# Patient Record
Sex: Male | Born: 1945 | Race: White | Hispanic: No
Health system: Southern US, Community
[De-identification: ages and names within clinical notes are randomized; demographics above are authoritative.]

---

## 2008-12-15 ENCOUNTER — Other Ambulatory Visit: Payer: Self-pay

## 2009-08-03 ENCOUNTER — Other Ambulatory Visit: Payer: Self-pay | Admitting: Emergency Medicine

## 2009-10-04 ENCOUNTER — Other Ambulatory Visit: Payer: Self-pay

## 2010-06-29 DIAGNOSIS — R7989 Other specified abnormal findings of blood chemistry: Secondary | ICD-10-CM

## 2010-06-29 DIAGNOSIS — R079 Chest pain, unspecified: Secondary | ICD-10-CM

## 2010-06-29 DIAGNOSIS — I059 Rheumatic mitral valve disease, unspecified: Secondary | ICD-10-CM

## 2010-07-11 ENCOUNTER — Other Ambulatory Visit: Payer: Self-pay | Admitting: Emergency Medicine

## 2010-08-10 ENCOUNTER — Other Ambulatory Visit: Payer: Self-pay | Admitting: Internal Medicine

## 2010-08-26 ENCOUNTER — Other Ambulatory Visit: Payer: Self-pay

## 2011-03-04 ENCOUNTER — Other Ambulatory Visit: Payer: Self-pay | Admitting: Emergency Medicine

## 2011-03-07 ENCOUNTER — Other Ambulatory Visit: Payer: Self-pay | Admitting: Emergency Medicine

## 2011-03-09 ENCOUNTER — Other Ambulatory Visit: Payer: Self-pay | Admitting: Vascular Surgery

## 2012-01-12 ENCOUNTER — Other Ambulatory Visit: Payer: Self-pay | Admitting: Emergency Medicine

## 2012-03-16 ENCOUNTER — Other Ambulatory Visit: Payer: Self-pay | Admitting: Emergency Medicine

## 2012-03-22 ENCOUNTER — Other Ambulatory Visit: Payer: Self-pay | Admitting: Emergency Medicine

## 2012-03-23 ENCOUNTER — Other Ambulatory Visit: Payer: Self-pay | Admitting: Internal Medicine

## 2012-03-30 ENCOUNTER — Other Ambulatory Visit: Payer: Self-pay | Admitting: Emergency Medicine

## 2012-04-04 ENCOUNTER — Other Ambulatory Visit: Payer: Self-pay | Admitting: Internal Medicine

## 2012-05-07 ENCOUNTER — Other Ambulatory Visit: Payer: Self-pay | Admitting: Emergency Medicine

## 2012-05-18 ENCOUNTER — Other Ambulatory Visit: Payer: Self-pay | Admitting: Emergency Medicine

## 2012-05-27 ENCOUNTER — Other Ambulatory Visit: Payer: Self-pay | Admitting: Emergency Medicine

## 2012-05-28 ENCOUNTER — Other Ambulatory Visit: Payer: Self-pay | Admitting: Emergency Medicine

## 2012-06-12 ENCOUNTER — Other Ambulatory Visit: Payer: Self-pay | Admitting: Internal Medicine

## 2012-12-10 ENCOUNTER — Other Ambulatory Visit: Payer: Self-pay

## 2013-01-10 ENCOUNTER — Other Ambulatory Visit: Payer: Self-pay | Admitting: Emergency Medicine

## 2013-01-21 ENCOUNTER — Other Ambulatory Visit: Payer: Self-pay

## 2013-01-27 ENCOUNTER — Other Ambulatory Visit: Payer: Self-pay | Admitting: Emergency Medicine

## 2013-01-30 ENCOUNTER — Other Ambulatory Visit: Payer: Self-pay | Admitting: Cardiology

## 2013-02-04 ENCOUNTER — Other Ambulatory Visit: Payer: Self-pay | Admitting: Emergency Medicine

## 2013-02-10 ENCOUNTER — Other Ambulatory Visit: Payer: Self-pay

## 2013-03-09 ENCOUNTER — Other Ambulatory Visit: Payer: Self-pay | Admitting: Emergency Medicine

## 2013-03-14 ENCOUNTER — Other Ambulatory Visit: Payer: Self-pay | Admitting: Emergency Medicine

## 2013-06-16 ENCOUNTER — Other Ambulatory Visit: Payer: Self-pay

## 2013-06-18 ENCOUNTER — Other Ambulatory Visit: Payer: Self-pay | Admitting: Internal Medicine

## 2013-06-26 ENCOUNTER — Inpatient Hospital Stay
Admission: AD | Admit: 2013-06-26 | Discharge: 2013-07-22 | Disposition: A | Discharge status: Skilled Nursing Facility | Payer: Medicare Other | Source: Ambulatory Visit | Attending: Internal Medicine | Admitting: Internal Medicine

## 2013-06-26 ENCOUNTER — Ambulatory Visit (HOSPITAL_COMMUNITY)
Admission: AD | Admit: 2013-06-26 | Discharge: 2013-06-26 | Disposition: A | Discharge status: Home / Self Care | Payer: Medicare Other | Source: Other Acute Inpatient Hospital | Attending: Internal Medicine | Admitting: Internal Medicine

## 2013-06-26 DIAGNOSIS — J9600 Acute respiratory failure, unspecified whether with hypoxia or hypercapnia: Secondary | ICD-10-CM | POA: Insufficient documentation

## 2013-06-27 ENCOUNTER — Other Ambulatory Visit (HOSPITAL_COMMUNITY): Payer: Medicare Other

## 2013-06-27 LAB — COMPREHENSIVE METABOLIC PANEL
ALT: 15 U/L (ref 0–53)
AST: 20 U/L (ref 0–37)
Albumin: 2.4 g/dL — ABNORMAL LOW (ref 3.5–5.2)
Alkaline Phosphatase: 63 U/L (ref 39–117)
BUN: 39 mg/dL — AB (ref 6–23)
CO2: 31 meq/L (ref 19–32)
CREATININE: 3.39 mg/dL — AB (ref 0.50–1.35)
Calcium: 8.2 mg/dL — ABNORMAL LOW (ref 8.4–10.5)
Chloride: 95 mEq/L — ABNORMAL LOW (ref 96–112)
GFR calc Af Amer: 20 mL/min — ABNORMAL LOW (ref >90)
GFR calc non Af Amer: 17 mL/min — ABNORMAL LOW (ref >90)
Glucose, Bld: 183 mg/dL — ABNORMAL HIGH (ref 70–99)
Potassium: 4.4 mEq/L (ref 3.7–5.3)
Sodium: 138 mEq/L (ref 137–147)
Total Protein: 5.8 g/dL — ABNORMAL LOW (ref 6.0–8.3)

## 2013-06-27 LAB — CBC WITH DIFFERENTIAL/PLATELET
Basophils Absolute: 0 10*3/uL (ref 0.0–0.1)
Basophils Relative: 0 % (ref 0–1)
Eosinophils Absolute: 0.2 10*3/uL (ref 0.0–0.7)
Eosinophils Relative: 2 % (ref 0–5)
HEMATOCRIT: 33.2 % — AB (ref 39.0–52.0)
HEMOGLOBIN: 9.5 g/dL — AB (ref 13.0–17.0)
LYMPHS PCT: 12 % (ref 12–46)
Lymphs Abs: 1 10*3/uL (ref 0.7–4.0)
MCH: 23.8 pg — ABNORMAL LOW (ref 26.0–34.0)
MCHC: 28.6 g/dL — ABNORMAL LOW (ref 30.0–36.0)
MCV: 83.2 fL (ref 78.0–100.0)
MONO ABS: 1 10*3/uL (ref 0.1–1.0)
MONOS PCT: 13 % — AB (ref 3–12)
NEUTROS ABS: 5.8 10*3/uL (ref 1.7–7.7)
Neutrophils Relative %: 73 % (ref 43–77)
Platelets: 232 10*3/uL (ref 150–400)
RBC: 3.99 MIL/uL — ABNORMAL LOW (ref 4.22–5.81)
RDW: 18.6 % — ABNORMAL HIGH (ref 11.5–15.5)
WBC: 7.9 10*3/uL (ref 4.0–10.5)

## 2013-06-27 LAB — PROCALCITONIN: Procalcitonin: 1.59 ng/mL

## 2013-06-27 LAB — VITAMIN B12: Vitamin B-12: 785 pg/mL (ref 211–911)

## 2013-06-27 LAB — BLOOD GAS, ARTERIAL
Acid-Base Excess: 6.5 mmol/L — ABNORMAL HIGH (ref 0.0–2.0)
BICARBONATE: 31.8 meq/L — AB (ref 20.0–24.0)
Delivery systems: POSITIVE
Expiratory PAP: 10
FIO2: 0.3 %
O2 Saturation: 96.9 %
PCO2 ART: 57.9 mmHg — AB (ref 35.0–45.0)
PH ART: 7.359 (ref 7.350–7.450)
PO2 ART: 94.9 mmHg (ref 80.0–100.0)
Patient temperature: 98.6
TCO2: 33.6 mmol/L (ref 0–100)

## 2013-06-27 LAB — HEMOGLOBIN A1C
Hgb A1c MFr Bld: 7.1 % — ABNORMAL HIGH (ref <5.7)
Mean Plasma Glucose: 157 mg/dL — ABNORMAL HIGH (ref <117)

## 2013-06-27 LAB — T4, FREE: FREE T4: 0.95 ng/dL (ref 0.80–1.80)

## 2013-06-27 LAB — MAGNESIUM: MAGNESIUM: 2 mg/dL (ref 1.5–2.5)

## 2013-06-27 LAB — TSH: TSH: 2.64 u[IU]/mL (ref 0.350–4.500)

## 2013-06-27 LAB — PROTIME-INR
INR: 1.08 (ref 0.00–1.49)
Prothrombin Time: 14 seconds (ref 11.6–15.2)

## 2013-06-27 LAB — FERRITIN: Ferritin: 131 ng/mL (ref 22–322)

## 2013-06-27 LAB — PHOSPHORUS: PHOSPHORUS: 4.7 mg/dL — AB (ref 2.3–4.6)

## 2013-06-27 LAB — HEPATITIS B SURFACE ANTIBODY,QUALITATIVE: Hep B S Ab: POSITIVE — AB

## 2013-06-27 LAB — PREALBUMIN: PREALBUMIN: 27.7 mg/dL (ref 17.0–34.0)

## 2013-06-27 LAB — APTT: aPTT: 36 seconds (ref 24–37)

## 2013-06-27 LAB — HEPATITIS B CORE ANTIBODY, TOTAL: HEP B C TOTAL AB: NONREACTIVE

## 2013-06-27 LAB — HEPATITIS B SURFACE ANTIGEN: Hepatitis B Surface Ag: NEGATIVE

## 2013-06-28 ENCOUNTER — Other Ambulatory Visit (HOSPITAL_COMMUNITY): Payer: Medicare Other

## 2013-06-28 LAB — BASIC METABOLIC PANEL
BUN: 25 mg/dL — ABNORMAL HIGH (ref 6–23)
CO2: 29 mEq/L (ref 19–32)
CREATININE: 3.05 mg/dL — AB (ref 0.50–1.35)
Calcium: 7.8 mg/dL — ABNORMAL LOW (ref 8.4–10.5)
Chloride: 100 mEq/L (ref 96–112)
GFR, EST AFRICAN AMERICAN: 23 mL/min — AB (ref >90)
GFR, EST NON AFRICAN AMERICAN: 20 mL/min — AB (ref >90)
Glucose, Bld: 170 mg/dL — ABNORMAL HIGH (ref 70–99)
POTASSIUM: 4.1 meq/L (ref 3.7–5.3)
Sodium: 141 mEq/L (ref 137–147)

## 2013-06-28 LAB — MAGNESIUM: MAGNESIUM: 1.9 mg/dL (ref 1.5–2.5)

## 2013-06-28 LAB — CBC WITH DIFFERENTIAL/PLATELET
BASOS ABS: 0 10*3/uL (ref 0.0–0.1)
BASOS PCT: 0 % (ref 0–1)
Eosinophils Absolute: 0.4 10*3/uL (ref 0.0–0.7)
Eosinophils Relative: 4 % (ref 0–5)
HCT: 34.1 % — ABNORMAL LOW (ref 39.0–52.0)
Hemoglobin: 9.7 g/dL — ABNORMAL LOW (ref 13.0–17.0)
Lymphocytes Relative: 10 % — ABNORMAL LOW (ref 12–46)
Lymphs Abs: 0.9 10*3/uL (ref 0.7–4.0)
MCH: 24.6 pg — ABNORMAL LOW (ref 26.0–34.0)
MCHC: 28.4 g/dL — AB (ref 30.0–36.0)
MCV: 86.5 fL (ref 78.0–100.0)
MONO ABS: 1.1 10*3/uL — AB (ref 0.1–1.0)
Monocytes Relative: 13 % — ABNORMAL HIGH (ref 3–12)
NEUTROS PCT: 73 % (ref 43–77)
Neutro Abs: 6.4 10*3/uL (ref 1.7–7.7)
Platelets: 193 10*3/uL (ref 150–400)
RBC: 3.94 MIL/uL — ABNORMAL LOW (ref 4.22–5.81)
RDW: 19.1 % — AB (ref 11.5–15.5)
WBC: 8.8 10*3/uL (ref 4.0–10.5)

## 2013-06-28 LAB — FOLATE RBC: RBC FOLATE: 487 ng/mL (ref >280)

## 2013-06-28 LAB — PHOSPHORUS: Phosphorus: 3 mg/dL (ref 2.3–4.6)

## 2013-06-30 LAB — HEPATIC FUNCTION PANEL
ALK PHOS: 70 U/L (ref 39–117)
ALT: 11 U/L (ref 0–53)
AST: 16 U/L (ref 0–37)
Albumin: 2.4 g/dL — ABNORMAL LOW (ref 3.5–5.2)
BILIRUBIN TOTAL: 0.2 mg/dL — AB (ref 0.3–1.2)
TOTAL PROTEIN: 5.9 g/dL — AB (ref 6.0–8.3)

## 2013-06-30 LAB — RENAL FUNCTION PANEL
ALBUMIN: 2.3 g/dL — AB (ref 3.5–5.2)
BUN: 54 mg/dL — AB (ref 6–23)
CALCIUM: 7.8 mg/dL — AB (ref 8.4–10.5)
CO2: 26 mEq/L (ref 19–32)
CREATININE: 4.82 mg/dL — AB (ref 0.50–1.35)
Chloride: 94 mEq/L — ABNORMAL LOW (ref 96–112)
GFR calc Af Amer: 13 mL/min — ABNORMAL LOW (ref >90)
GFR calc non Af Amer: 11 mL/min — ABNORMAL LOW (ref >90)
Glucose, Bld: 160 mg/dL — ABNORMAL HIGH (ref 70–99)
PHOSPHORUS: 2.6 mg/dL (ref 2.3–4.6)
Potassium: 4.9 mEq/L (ref 3.7–5.3)
Sodium: 133 mEq/L — ABNORMAL LOW (ref 137–147)

## 2013-06-30 LAB — PREALBUMIN: PREALBUMIN: 21.3 mg/dL (ref 17.0–34.0)

## 2013-06-30 LAB — CBC WITH DIFFERENTIAL/PLATELET
BASOS ABS: 0 10*3/uL (ref 0.0–0.1)
BASOS PCT: 0 % (ref 0–1)
Eosinophils Absolute: 0.4 10*3/uL (ref 0.0–0.7)
Eosinophils Relative: 4 % (ref 0–5)
HCT: 30.8 % — ABNORMAL LOW (ref 39.0–52.0)
Hemoglobin: 8.8 g/dL — ABNORMAL LOW (ref 13.0–17.0)
Lymphocytes Relative: 10 % — ABNORMAL LOW (ref 12–46)
Lymphs Abs: 0.9 10*3/uL (ref 0.7–4.0)
MCH: 25.1 pg — AB (ref 26.0–34.0)
MCHC: 28.6 g/dL — ABNORMAL LOW (ref 30.0–36.0)
MCV: 87.7 fL (ref 78.0–100.0)
Monocytes Absolute: 1.3 10*3/uL — ABNORMAL HIGH (ref 0.1–1.0)
Monocytes Relative: 14 % — ABNORMAL HIGH (ref 3–12)
NEUTROS ABS: 6.8 10*3/uL (ref 1.7–7.7)
NEUTROS PCT: 72 % (ref 43–77)
PLATELETS: 141 10*3/uL — AB (ref 150–400)
RBC: 3.51 MIL/uL — ABNORMAL LOW (ref 4.22–5.81)
RDW: 18.9 % — AB (ref 11.5–15.5)
WBC: 9.3 10*3/uL (ref 4.0–10.5)

## 2013-07-02 LAB — CBC
HCT: 27.4 % — ABNORMAL LOW (ref 39.0–52.0)
Hemoglobin: 7.9 g/dL — ABNORMAL LOW (ref 13.0–17.0)
MCH: 24.8 pg — AB (ref 26.0–34.0)
MCHC: 28.8 g/dL — ABNORMAL LOW (ref 30.0–36.0)
MCV: 85.9 fL (ref 78.0–100.0)
PLATELETS: 111 10*3/uL — AB (ref 150–400)
RBC: 3.19 MIL/uL — AB (ref 4.22–5.81)
RDW: 18.9 % — AB (ref 11.5–15.5)
WBC: 10.4 10*3/uL (ref 4.0–10.5)

## 2013-07-02 LAB — RENAL FUNCTION PANEL
ALBUMIN: 2.3 g/dL — AB (ref 3.5–5.2)
BUN: 56 mg/dL — ABNORMAL HIGH (ref 6–23)
CHLORIDE: 94 meq/L — AB (ref 96–112)
CO2: 27 mEq/L (ref 19–32)
Calcium: 8.2 mg/dL — ABNORMAL LOW (ref 8.4–10.5)
Creatinine, Ser: 5.26 mg/dL — ABNORMAL HIGH (ref 0.50–1.35)
GFR calc Af Amer: 12 mL/min — ABNORMAL LOW (ref >90)
GFR calc non Af Amer: 10 mL/min — ABNORMAL LOW (ref >90)
Glucose, Bld: 114 mg/dL — ABNORMAL HIGH (ref 70–99)
POTASSIUM: 5.4 meq/L — AB (ref 3.7–5.3)
Phosphorus: 3 mg/dL (ref 2.3–4.6)
Sodium: 133 mEq/L — ABNORMAL LOW (ref 137–147)

## 2013-07-03 ENCOUNTER — Other Ambulatory Visit (HOSPITAL_COMMUNITY): Payer: Medicare Other

## 2013-07-03 LAB — CK TOTAL AND CKMB (NOT AT ARMC)
CK TOTAL: 21 U/L (ref 7–232)
CK, MB: 5.2 ng/mL — ABNORMAL HIGH (ref 0.3–4.0)
CK, MB: 4.2 ng/mL — AB (ref 0.3–4.0)
CK, MB: 4.3 ng/mL — ABNORMAL HIGH (ref 0.3–4.0)
RELATIVE INDEX: INVALID (ref 0.0–2.5)
RELATIVE INDEX: INVALID (ref 0.0–2.5)
Relative Index: INVALID (ref 0.0–2.5)
Total CK: 20 U/L (ref 7–232)
Total CK: 21 U/L (ref 7–232)

## 2013-07-03 LAB — POTASSIUM: POTASSIUM: 5.9 meq/L — AB (ref 3.7–5.3)

## 2013-07-03 LAB — TROPONIN I: Troponin I: <0.3 ng/mL (ref <0.30)

## 2013-07-04 ENCOUNTER — Institutional Professional Consult (permissible substitution) (HOSPITAL_COMMUNITY): Payer: Medicare Other

## 2013-07-04 ENCOUNTER — Other Ambulatory Visit (HOSPITAL_COMMUNITY): Payer: Medicare Other

## 2013-07-04 LAB — RENAL FUNCTION PANEL
ALBUMIN: 2.3 g/dL — AB (ref 3.5–5.2)
ANION GAP: 13 (ref 5–15)
BUN: 57 mg/dL — ABNORMAL HIGH (ref 6–23)
CHLORIDE: 94 meq/L — AB (ref 96–112)
CO2: 26 mEq/L (ref 19–32)
Calcium: 8.8 mg/dL (ref 8.4–10.5)
Creatinine, Ser: 5.4 mg/dL — ABNORMAL HIGH (ref 0.50–1.35)
GFR calc Af Amer: 11 mL/min — ABNORMAL LOW (ref >90)
GFR calc non Af Amer: 10 mL/min — ABNORMAL LOW (ref >90)
Glucose, Bld: 153 mg/dL — ABNORMAL HIGH (ref 70–99)
POTASSIUM: 6 meq/L — AB (ref 3.7–5.3)
Phosphorus: 3.9 mg/dL (ref 2.3–4.6)
Sodium: 133 mEq/L — ABNORMAL LOW (ref 137–147)

## 2013-07-04 LAB — URINALYSIS, ROUTINE W REFLEX MICROSCOPIC
GLUCOSE, UA: 250 mg/dL — AB
Ketones, ur: 15 mg/dL — AB
Nitrite: NEGATIVE
PH: 6 (ref 5.0–8.0)
Specific Gravity, Urine: 1.034 — ABNORMAL HIGH (ref 1.005–1.030)
Urobilinogen, UA: 0.2 mg/dL (ref 0.0–1.0)

## 2013-07-04 LAB — CBC
HCT: 28.5 % — ABNORMAL LOW (ref 39.0–52.0)
Hemoglobin: 8.1 g/dL — ABNORMAL LOW (ref 13.0–17.0)
MCH: 24.5 pg — ABNORMAL LOW (ref 26.0–34.0)
MCHC: 28.4 g/dL — ABNORMAL LOW (ref 30.0–36.0)
MCV: 86.1 fL (ref 78.0–100.0)
PLATELETS: 111 10*3/uL — AB (ref 150–400)
RBC: 3.31 MIL/uL — ABNORMAL LOW (ref 4.22–5.81)
RDW: 18.6 % — AB (ref 11.5–15.5)
WBC: 10 10*3/uL (ref 4.0–10.5)

## 2013-07-04 LAB — URINE MICROSCOPIC-ADD ON

## 2013-07-05 ENCOUNTER — Other Ambulatory Visit (HOSPITAL_COMMUNITY): Payer: Medicare Other

## 2013-07-05 LAB — URINE CULTURE
CULTURE: NO GROWTH
Colony Count: NO GROWTH

## 2013-07-05 LAB — CBC
HCT: 26.8 % — ABNORMAL LOW (ref 39.0–52.0)
HEMOGLOBIN: 7.7 g/dL — AB (ref 13.0–17.0)
MCH: 24.1 pg — AB (ref 26.0–34.0)
MCHC: 28.7 g/dL — AB (ref 30.0–36.0)
MCV: 84 fL (ref 78.0–100.0)
PLATELETS: 126 10*3/uL — AB (ref 150–400)
RBC: 3.19 MIL/uL — ABNORMAL LOW (ref 4.22–5.81)
RDW: 18.5 % — AB (ref 11.5–15.5)
WBC: 7.2 10*3/uL (ref 4.0–10.5)

## 2013-07-05 LAB — BASIC METABOLIC PANEL
ANION GAP: 14 (ref 5–15)
BUN: 43 mg/dL — ABNORMAL HIGH (ref 6–23)
CALCIUM: 8.7 mg/dL (ref 8.4–10.5)
CO2: 27 mEq/L (ref 19–32)
Chloride: 95 mEq/L — ABNORMAL LOW (ref 96–112)
Creatinine, Ser: 4.33 mg/dL — ABNORMAL HIGH (ref 0.50–1.35)
GFR calc non Af Amer: 13 mL/min — ABNORMAL LOW (ref >90)
GFR, EST AFRICAN AMERICAN: 15 mL/min — AB (ref >90)
GLUCOSE: 106 mg/dL — AB (ref 70–99)
POTASSIUM: 4.9 meq/L (ref 3.7–5.3)
Sodium: 136 mEq/L — ABNORMAL LOW (ref 137–147)

## 2013-07-06 ENCOUNTER — Other Ambulatory Visit (HOSPITAL_COMMUNITY): Payer: Medicare Other

## 2013-07-06 LAB — MAGNESIUM: Magnesium: 2.1 mg/dL (ref 1.5–2.5)

## 2013-07-06 LAB — CBC WITH DIFFERENTIAL/PLATELET
BASOS PCT: 0 % (ref 0–1)
Basophils Absolute: 0 10*3/uL (ref 0.0–0.1)
Eosinophils Absolute: 0.2 10*3/uL (ref 0.0–0.7)
Eosinophils Relative: 3 % (ref 0–5)
HCT: 26.9 % — ABNORMAL LOW (ref 39.0–52.0)
HEMOGLOBIN: 7.9 g/dL — AB (ref 13.0–17.0)
Lymphocytes Relative: 9 % — ABNORMAL LOW (ref 12–46)
Lymphs Abs: 0.7 10*3/uL (ref 0.7–4.0)
MCH: 24.5 pg — ABNORMAL LOW (ref 26.0–34.0)
MCHC: 29.4 g/dL — ABNORMAL LOW (ref 30.0–36.0)
MCV: 83.3 fL (ref 78.0–100.0)
MONOS PCT: 11 % (ref 3–12)
Monocytes Absolute: 0.8 10*3/uL (ref 0.1–1.0)
Neutro Abs: 5.5 10*3/uL (ref 1.7–7.7)
Neutrophils Relative %: 77 % (ref 43–77)
Platelets: 124 10*3/uL — ABNORMAL LOW (ref 150–400)
RBC: 3.23 MIL/uL — AB (ref 4.22–5.81)
RDW: 18.3 % — ABNORMAL HIGH (ref 11.5–15.5)
WBC: 7.2 10*3/uL (ref 4.0–10.5)

## 2013-07-06 LAB — BASIC METABOLIC PANEL
Anion gap: 13 (ref 5–15)
BUN: 56 mg/dL — AB (ref 6–23)
CO2: 28 mEq/L (ref 19–32)
Calcium: 9 mg/dL (ref 8.4–10.5)
Chloride: 92 mEq/L — ABNORMAL LOW (ref 96–112)
Creatinine, Ser: 5.29 mg/dL — ABNORMAL HIGH (ref 0.50–1.35)
GFR calc non Af Amer: 10 mL/min — ABNORMAL LOW (ref >90)
GFR, EST AFRICAN AMERICAN: 12 mL/min — AB (ref >90)
Glucose, Bld: 80 mg/dL (ref 70–99)
POTASSIUM: 5.4 meq/L — AB (ref 3.7–5.3)
SODIUM: 133 meq/L — AB (ref 137–147)

## 2013-07-06 LAB — PHOSPHORUS: PHOSPHORUS: 3.8 mg/dL (ref 2.3–4.6)

## 2013-07-07 LAB — RENAL FUNCTION PANEL
ALBUMIN: 2.4 g/dL — AB (ref 3.5–5.2)
ANION GAP: 13 (ref 5–15)
BUN: 63 mg/dL — ABNORMAL HIGH (ref 6–23)
CALCIUM: 8.7 mg/dL (ref 8.4–10.5)
CO2: 27 mEq/L (ref 19–32)
Chloride: 92 mEq/L — ABNORMAL LOW (ref 96–112)
Creatinine, Ser: 6.04 mg/dL — ABNORMAL HIGH (ref 0.50–1.35)
GFR, EST AFRICAN AMERICAN: 10 mL/min — AB (ref >90)
GFR, EST NON AFRICAN AMERICAN: 9 mL/min — AB (ref >90)
Glucose, Bld: 116 mg/dL — ABNORMAL HIGH (ref 70–99)
PHOSPHORUS: 4.1 mg/dL (ref 2.3–4.6)
Potassium: 5.3 mEq/L (ref 3.7–5.3)
SODIUM: 132 meq/L — AB (ref 137–147)

## 2013-07-07 LAB — CBC
HEMATOCRIT: 27.1 % — AB (ref 39.0–52.0)
Hemoglobin: 7.9 g/dL — ABNORMAL LOW (ref 13.0–17.0)
MCH: 24.2 pg — ABNORMAL LOW (ref 26.0–34.0)
MCHC: 29.2 g/dL — ABNORMAL LOW (ref 30.0–36.0)
MCV: 83.1 fL (ref 78.0–100.0)
Platelets: 140 10*3/uL — ABNORMAL LOW (ref 150–400)
RBC: 3.26 MIL/uL — ABNORMAL LOW (ref 4.22–5.81)
RDW: 18.3 % — AB (ref 11.5–15.5)
WBC: 6.5 10*3/uL (ref 4.0–10.5)

## 2013-07-07 LAB — PREALBUMIN: PREALBUMIN: 11 mg/dL — AB (ref 17.0–34.0)

## 2013-07-08 LAB — BASIC METABOLIC PANEL
ANION GAP: 13 (ref 5–15)
BUN: 45 mg/dL — AB (ref 6–23)
CALCIUM: 8.6 mg/dL (ref 8.4–10.5)
CO2: 29 mEq/L (ref 19–32)
CREATININE: 4.41 mg/dL — AB (ref 0.50–1.35)
Chloride: 93 mEq/L — ABNORMAL LOW (ref 96–112)
GFR, EST AFRICAN AMERICAN: 15 mL/min — AB (ref >90)
GFR, EST NON AFRICAN AMERICAN: 13 mL/min — AB (ref >90)
Glucose, Bld: 115 mg/dL — ABNORMAL HIGH (ref 70–99)
Potassium: 5 mEq/L (ref 3.7–5.3)
Sodium: 135 mEq/L — ABNORMAL LOW (ref 137–147)

## 2013-07-08 LAB — PHOSPHORUS: Phosphorus: 3.1 mg/dL (ref 2.3–4.6)

## 2013-07-08 LAB — MAGNESIUM: Magnesium: 2 mg/dL (ref 1.5–2.5)

## 2013-07-09 LAB — CBC
HEMATOCRIT: 28.3 % — AB (ref 39.0–52.0)
Hemoglobin: 8.2 g/dL — ABNORMAL LOW (ref 13.0–17.0)
MCH: 24.5 pg — AB (ref 26.0–34.0)
MCHC: 29 g/dL — AB (ref 30.0–36.0)
MCV: 84.5 fL (ref 78.0–100.0)
PLATELETS: 159 10*3/uL (ref 150–400)
RBC: 3.35 MIL/uL — ABNORMAL LOW (ref 4.22–5.81)
RDW: 18.4 % — AB (ref 11.5–15.5)
WBC: 6.6 10*3/uL (ref 4.0–10.5)

## 2013-07-09 LAB — RENAL FUNCTION PANEL
ALBUMIN: 2.6 g/dL — AB (ref 3.5–5.2)
Anion gap: 15 (ref 5–15)
BUN: 53 mg/dL — AB (ref 6–23)
CALCIUM: 8.5 mg/dL (ref 8.4–10.5)
CO2: 29 mEq/L (ref 19–32)
Chloride: 90 mEq/L — ABNORMAL LOW (ref 96–112)
Creatinine, Ser: 5.27 mg/dL — ABNORMAL HIGH (ref 0.50–1.35)
GFR calc Af Amer: 12 mL/min — ABNORMAL LOW (ref >90)
GFR, EST NON AFRICAN AMERICAN: 10 mL/min — AB (ref >90)
Glucose, Bld: 108 mg/dL — ABNORMAL HIGH (ref 70–99)
Phosphorus: 3.7 mg/dL (ref 2.3–4.6)
Potassium: 4.6 mEq/L (ref 3.7–5.3)
Sodium: 134 mEq/L — ABNORMAL LOW (ref 137–147)

## 2013-07-11 ENCOUNTER — Other Ambulatory Visit (HOSPITAL_COMMUNITY): Payer: Medicare Other

## 2013-07-11 LAB — RENAL FUNCTION PANEL
Albumin: 2.4 g/dL — ABNORMAL LOW (ref 3.5–5.2)
Anion gap: 13 (ref 5–15)
BUN: 44 mg/dL — AB (ref 6–23)
CHLORIDE: 94 meq/L — AB (ref 96–112)
CO2: 28 meq/L (ref 19–32)
Calcium: 8.6 mg/dL (ref 8.4–10.5)
Creatinine, Ser: 5.16 mg/dL — ABNORMAL HIGH (ref 0.50–1.35)
GFR calc Af Amer: 12 mL/min — ABNORMAL LOW (ref >90)
GFR calc non Af Amer: 10 mL/min — ABNORMAL LOW (ref >90)
Glucose, Bld: 124 mg/dL — ABNORMAL HIGH (ref 70–99)
Phosphorus: 3.2 mg/dL (ref 2.3–4.6)
Potassium: 5 mEq/L (ref 3.7–5.3)
Sodium: 135 mEq/L — ABNORMAL LOW (ref 137–147)

## 2013-07-11 LAB — CBC
HEMATOCRIT: 27 % — AB (ref 39.0–52.0)
Hemoglobin: 7.7 g/dL — ABNORMAL LOW (ref 13.0–17.0)
MCH: 24.1 pg — ABNORMAL LOW (ref 26.0–34.0)
MCHC: 28.5 g/dL — ABNORMAL LOW (ref 30.0–36.0)
MCV: 84.4 fL (ref 78.0–100.0)
PLATELETS: 181 10*3/uL (ref 150–400)
RBC: 3.2 MIL/uL — AB (ref 4.22–5.81)
RDW: 18.5 % — AB (ref 11.5–15.5)
WBC: 6.7 10*3/uL (ref 4.0–10.5)

## 2013-07-14 ENCOUNTER — Institutional Professional Consult (permissible substitution) (HOSPITAL_COMMUNITY): Payer: Medicare Other

## 2013-07-14 ENCOUNTER — Other Ambulatory Visit (HOSPITAL_COMMUNITY): Payer: Medicare Other

## 2013-07-14 LAB — LACTATE DEHYDROGENASE, PLEURAL OR PERITONEAL FLUID: LD, Fluid: 79 U/L — ABNORMAL HIGH (ref 3–23)

## 2013-07-14 LAB — RENAL FUNCTION PANEL
Albumin: 2.4 g/dL — ABNORMAL LOW (ref 3.5–5.2)
Anion gap: 15 (ref 5–15)
BUN: 66 mg/dL — AB (ref 6–23)
CALCIUM: 8.8 mg/dL (ref 8.4–10.5)
CHLORIDE: 90 meq/L — AB (ref 96–112)
CO2: 26 mEq/L (ref 19–32)
CREATININE: 6.18 mg/dL — AB (ref 0.50–1.35)
GFR calc Af Amer: 10 mL/min — ABNORMAL LOW (ref >90)
GFR calc non Af Amer: 8 mL/min — ABNORMAL LOW (ref >90)
GLUCOSE: 103 mg/dL — AB (ref 70–99)
Phosphorus: 3.3 mg/dL (ref 2.3–4.6)
Potassium: 5.6 mEq/L — ABNORMAL HIGH (ref 3.7–5.3)
Sodium: 131 mEq/L — ABNORMAL LOW (ref 137–147)

## 2013-07-14 LAB — CBC
HEMATOCRIT: 26.2 % — AB (ref 39.0–52.0)
Hemoglobin: 7.5 g/dL — ABNORMAL LOW (ref 13.0–17.0)
MCH: 24 pg — AB (ref 26.0–34.0)
MCHC: 28.6 g/dL — ABNORMAL LOW (ref 30.0–36.0)
MCV: 84 fL (ref 78.0–100.0)
Platelets: 205 10*3/uL (ref 150–400)
RBC: 3.12 MIL/uL — AB (ref 4.22–5.81)
RDW: 18.5 % — ABNORMAL HIGH (ref 11.5–15.5)
WBC: 7.8 10*3/uL (ref 4.0–10.5)

## 2013-07-14 LAB — BODY FLUID CELL COUNT WITH DIFFERENTIAL
Eos, Fluid: 1 %
Lymphs, Fluid: 42 %
Monocyte-Macrophage-Serous Fluid: 50 % (ref 50–90)
Neutrophil Count, Fluid: 7 % (ref 0–25)
WBC FLUID: 449 uL (ref 0–1000)

## 2013-07-14 LAB — PREALBUMIN: PREALBUMIN: 9.4 mg/dL — AB (ref 17.0–34.0)

## 2013-07-14 NOTE — Procedures (Signed)
  US guided bedside thora  1.2 liters yellow fluid Sent for labs per MD  Port CXR pending

## 2013-07-15 ENCOUNTER — Encounter: Payer: Self-pay | Admitting: Radiology

## 2013-07-15 ENCOUNTER — Other Ambulatory Visit (HOSPITAL_COMMUNITY): Payer: Medicare Other

## 2013-07-15 LAB — BASIC METABOLIC PANEL
ANION GAP: 13 (ref 5–15)
BUN: 39 mg/dL — ABNORMAL HIGH (ref 6–23)
CHLORIDE: 93 meq/L — AB (ref 96–112)
CO2: 27 meq/L (ref 19–32)
CREATININE: 4.31 mg/dL — AB (ref 0.50–1.35)
Calcium: 8.6 mg/dL (ref 8.4–10.5)
GFR calc Af Amer: 15 mL/min — ABNORMAL LOW (ref >90)
GFR calc non Af Amer: 13 mL/min — ABNORMAL LOW (ref >90)
Glucose, Bld: 113 mg/dL — ABNORMAL HIGH (ref 70–99)
Potassium: 4.7 mEq/L (ref 3.7–5.3)
Sodium: 133 mEq/L — ABNORMAL LOW (ref 137–147)

## 2013-07-15 LAB — BLOOD GAS, ARTERIAL
Acid-Base Excess: 1.3 mmol/L (ref 0.0–2.0)
Bicarbonate: 27.4 mEq/L — ABNORMAL HIGH (ref 20.0–24.0)
O2 Content: 3.5 L/min
O2 Saturation: 98 %
PO2 ART: 113 mmHg — AB (ref 80.0–100.0)
Patient temperature: 98.6
TCO2: 29.3 mmol/L (ref 0–100)
pCO2 arterial: 60.9 mmHg (ref 35.0–45.0)
pH, Arterial: 7.275 — ABNORMAL LOW (ref 7.350–7.450)

## 2013-07-15 LAB — CBC
HEMATOCRIT: 28.3 % — AB (ref 39.0–52.0)
HEMOGLOBIN: 8.2 g/dL — AB (ref 13.0–17.0)
MCH: 24.9 pg — ABNORMAL LOW (ref 26.0–34.0)
MCHC: 29 g/dL — ABNORMAL LOW (ref 30.0–36.0)
MCV: 86 fL (ref 78.0–100.0)
Platelets: 227 10*3/uL (ref 150–400)
RBC: 3.29 MIL/uL — ABNORMAL LOW (ref 4.22–5.81)
RDW: 18.4 % — ABNORMAL HIGH (ref 11.5–15.5)
WBC: 6.6 10*3/uL (ref 4.0–10.5)

## 2013-07-15 MED ORDER — IOHEXOL 300 MG/ML  SOLN
80.0000 mL | Freq: Once | INTRAMUSCULAR | Status: AC | PRN
  Administered 2013-07-15: 80 mL via INTRAVENOUS

## 2013-07-16 LAB — PATHOLOGIST SMEAR REVIEW

## 2013-07-16 LAB — CBC
HEMATOCRIT: 26.4 % — AB (ref 39.0–52.0)
HEMOGLOBIN: 7.6 g/dL — AB (ref 13.0–17.0)
MCH: 24.2 pg — ABNORMAL LOW (ref 26.0–34.0)
MCHC: 28.8 g/dL — ABNORMAL LOW (ref 30.0–36.0)
MCV: 84.1 fL (ref 78.0–100.0)
Platelets: 245 10*3/uL (ref 150–400)
RBC: 3.14 MIL/uL — ABNORMAL LOW (ref 4.22–5.81)
RDW: 18.4 % — ABNORMAL HIGH (ref 11.5–15.5)
WBC: 6.2 10*3/uL (ref 4.0–10.5)

## 2013-07-16 LAB — RENAL FUNCTION PANEL
ALBUMIN: 2.2 g/dL — AB (ref 3.5–5.2)
ANION GAP: 14 (ref 5–15)
BUN: 50 mg/dL — AB (ref 6–23)
CHLORIDE: 90 meq/L — AB (ref 96–112)
CO2: 26 mEq/L (ref 19–32)
Calcium: 8.8 mg/dL (ref 8.4–10.5)
Creatinine, Ser: 5.34 mg/dL — ABNORMAL HIGH (ref 0.50–1.35)
GFR calc non Af Amer: 10 mL/min — ABNORMAL LOW (ref >90)
GFR, EST AFRICAN AMERICAN: 12 mL/min — AB (ref >90)
Glucose, Bld: 126 mg/dL — ABNORMAL HIGH (ref 70–99)
Phosphorus: 3.4 mg/dL (ref 2.3–4.6)
Potassium: 4.8 mEq/L (ref 3.7–5.3)
Sodium: 130 mEq/L — ABNORMAL LOW (ref 137–147)

## 2013-07-17 ENCOUNTER — Other Ambulatory Visit (HOSPITAL_COMMUNITY): Payer: Medicare Other

## 2013-07-17 NOTE — Procedures (Signed)
  Portable US guided thoraRt  1.5liters yellow fluid  Sent for labs cxr pending  Tolerated well

## 2013-07-18 LAB — CBC
HCT: 25.9 % — ABNORMAL LOW (ref 39.0–52.0)
HEMOGLOBIN: 7.6 g/dL — AB (ref 13.0–17.0)
MCH: 25.2 pg — ABNORMAL LOW (ref 26.0–34.0)
MCHC: 29.3 g/dL — AB (ref 30.0–36.0)
MCV: 85.8 fL (ref 78.0–100.0)
PLATELETS: 240 10*3/uL (ref 150–400)
RBC: 3.02 MIL/uL — ABNORMAL LOW (ref 4.22–5.81)
RDW: 18.8 % — ABNORMAL HIGH (ref 11.5–15.5)
WBC: 6 10*3/uL (ref 4.0–10.5)

## 2013-07-18 LAB — RENAL FUNCTION PANEL
ALBUMIN: 2.3 g/dL — AB (ref 3.5–5.2)
Anion gap: 12 (ref 5–15)
BUN: 42 mg/dL — ABNORMAL HIGH (ref 6–23)
CO2: 27 mEq/L (ref 19–32)
Calcium: 8.3 mg/dL — ABNORMAL LOW (ref 8.4–10.5)
Chloride: 94 mEq/L — ABNORMAL LOW (ref 96–112)
Creatinine, Ser: 5.46 mg/dL — ABNORMAL HIGH (ref 0.50–1.35)
GFR, EST AFRICAN AMERICAN: 11 mL/min — AB (ref >90)
GFR, EST NON AFRICAN AMERICAN: 10 mL/min — AB (ref >90)
Glucose, Bld: 94 mg/dL (ref 70–99)
PHOSPHORUS: 2.5 mg/dL (ref 2.3–4.6)
POTASSIUM: 4.6 meq/L (ref 3.7–5.3)
SODIUM: 133 meq/L — AB (ref 137–147)

## 2013-07-19 ENCOUNTER — Other Ambulatory Visit (HOSPITAL_COMMUNITY): Payer: Medicare Other

## 2013-07-21 ENCOUNTER — Other Ambulatory Visit (HOSPITAL_COMMUNITY): Payer: Medicare Other

## 2013-07-21 LAB — CBC WITH DIFFERENTIAL/PLATELET
BASOS ABS: 0 10*3/uL (ref 0.0–0.1)
BASOS PCT: 1 % (ref 0–1)
EOS ABS: 0.4 10*3/uL (ref 0.0–0.7)
Eosinophils Relative: 7 % — ABNORMAL HIGH (ref 0–5)
HCT: 25.2 % — ABNORMAL LOW (ref 39.0–52.0)
Hemoglobin: 7.3 g/dL — ABNORMAL LOW (ref 13.0–17.0)
Lymphocytes Relative: 19 % (ref 12–46)
Lymphs Abs: 1 10*3/uL (ref 0.7–4.0)
MCH: 24.3 pg — ABNORMAL LOW (ref 26.0–34.0)
MCHC: 29 g/dL — AB (ref 30.0–36.0)
MCV: 83.7 fL (ref 78.0–100.0)
Monocytes Absolute: 0.8 10*3/uL (ref 0.1–1.0)
Monocytes Relative: 14 % — ABNORMAL HIGH (ref 3–12)
NEUTROS ABS: 3.2 10*3/uL (ref 1.7–7.7)
NEUTROS PCT: 59 % (ref 43–77)
PLATELETS: 208 10*3/uL (ref 150–400)
RBC: 3.01 MIL/uL — ABNORMAL LOW (ref 4.22–5.81)
RDW: 18.9 % — ABNORMAL HIGH (ref 11.5–15.5)
WBC: 5.4 10*3/uL (ref 4.0–10.5)

## 2013-07-21 LAB — PREALBUMIN: Prealbumin: 9.3 mg/dL — ABNORMAL LOW (ref 17.0–34.0)

## 2013-07-21 LAB — COMPREHENSIVE METABOLIC PANEL
ALK PHOS: 104 U/L (ref 39–117)
ALT: 7 U/L (ref 0–53)
AST: 18 U/L (ref 0–37)
Albumin: 2.3 g/dL — ABNORMAL LOW (ref 3.5–5.2)
Anion gap: 13 (ref 5–15)
BILIRUBIN TOTAL: 0.2 mg/dL — AB (ref 0.3–1.2)
BUN: 43 mg/dL — ABNORMAL HIGH (ref 6–23)
CALCIUM: 9.1 mg/dL (ref 8.4–10.5)
CHLORIDE: 95 meq/L — AB (ref 96–112)
CO2: 27 mEq/L (ref 19–32)
Creatinine, Ser: 5.43 mg/dL — ABNORMAL HIGH (ref 0.50–1.35)
GFR, EST AFRICAN AMERICAN: 11 mL/min — AB (ref >90)
GFR, EST NON AFRICAN AMERICAN: 10 mL/min — AB (ref >90)
GLUCOSE: 82 mg/dL (ref 70–99)
POTASSIUM: 5.5 meq/L — AB (ref 3.7–5.3)
SODIUM: 135 meq/L — AB (ref 137–147)
Total Protein: 6.1 g/dL (ref 6.0–8.3)

## 2013-07-21 LAB — ABO/RH: ABO/RH(D): O NEG

## 2013-07-21 LAB — CULTURE, BLOOD (ROUTINE X 2): Culture: NO GROWTH

## 2013-07-21 LAB — PREPARE RBC (CROSSMATCH)

## 2013-07-21 LAB — MAGNESIUM: Magnesium: 2 mg/dL (ref 1.5–2.5)

## 2013-07-21 LAB — PHOSPHORUS: PHOSPHORUS: 3.7 mg/dL (ref 2.3–4.6)

## 2013-07-22 LAB — TYPE AND SCREEN
ABO/RH(D): O NEG
ANTIBODY SCREEN: NEGATIVE
UNIT DIVISION: 0
Unit division: 0

## 2013-07-22 LAB — BASIC METABOLIC PANEL
ANION GAP: 12 (ref 5–15)
BUN: 27 mg/dL — ABNORMAL HIGH (ref 6–23)
CO2: 29 mEq/L (ref 19–32)
CREATININE: 3.99 mg/dL — AB (ref 0.50–1.35)
Calcium: 8.8 mg/dL (ref 8.4–10.5)
Chloride: 94 mEq/L — ABNORMAL LOW (ref 96–112)
GFR calc non Af Amer: 14 mL/min — ABNORMAL LOW (ref >90)
GFR, EST AFRICAN AMERICAN: 16 mL/min — AB (ref >90)
Glucose, Bld: 87 mg/dL (ref 70–99)
Potassium: 4.4 mEq/L (ref 3.7–5.3)
Sodium: 135 mEq/L — ABNORMAL LOW (ref 137–147)

## 2013-07-22 LAB — CBC WITH DIFFERENTIAL/PLATELET
BASOS ABS: 0 10*3/uL (ref 0.0–0.1)
Basophils Relative: 1 % (ref 0–1)
EOS PCT: 6 % — AB (ref 0–5)
Eosinophils Absolute: 0.4 10*3/uL (ref 0.0–0.7)
HCT: 32.5 % — ABNORMAL LOW (ref 39.0–52.0)
Hemoglobin: 9.6 g/dL — ABNORMAL LOW (ref 13.0–17.0)
Lymphocytes Relative: 17 % (ref 12–46)
Lymphs Abs: 1.1 10*3/uL (ref 0.7–4.0)
MCH: 25 pg — ABNORMAL LOW (ref 26.0–34.0)
MCHC: 29.5 g/dL — ABNORMAL LOW (ref 30.0–36.0)
MCV: 84.6 fL (ref 78.0–100.0)
MONO ABS: 1.3 10*3/uL — AB (ref 0.1–1.0)
Monocytes Relative: 20 % — ABNORMAL HIGH (ref 3–12)
NEUTROS ABS: 3.8 10*3/uL (ref 1.7–7.7)
Neutrophils Relative %: 58 % (ref 43–77)
PLATELETS: 207 10*3/uL (ref 150–400)
RBC: 3.84 MIL/uL — ABNORMAL LOW (ref 4.22–5.81)
RDW: 18.2 % — AB (ref 11.5–15.5)
WBC: 6.5 10*3/uL (ref 4.0–10.5)

## 2013-07-22 LAB — PHOSPHORUS: Phosphorus: 2.9 mg/dL (ref 2.3–4.6)

## 2013-07-22 LAB — CULTURE, BLOOD (ROUTINE X 2): Culture: NO GROWTH

## 2013-07-22 LAB — MAGNESIUM: MAGNESIUM: 2 mg/dL (ref 1.5–2.5)

## 2013-08-04 ENCOUNTER — Other Ambulatory Visit: Payer: Self-pay | Admitting: Emergency Medicine

## 2013-08-08 ENCOUNTER — Other Ambulatory Visit: Payer: Self-pay | Admitting: Student

## 2013-09-02 DEATH — deceased

## 2015-06-27 IMAGING — CT CT STONE STUDY
2 of 5 series · 16 of 46 positions shown, 18 images · non-contrast
Comparison: CT of the abdomen and pelvis March 07, 2011

CLINICAL DATA: Right upper quadrant/ right flank pain. Cough,
shortness of breath.

EXAM:
CT ABDOMEN AND PELVIS WITHOUT CONTRAST
TECHNIQUE: Multidetector CT imaging of the abdomen and pelvis was performed
following the standard protocol without IV contrast.

[Series 2: stone standard full · axial · 0.92mm/px · z∈[-462,-37]mm · 13 of 97 slices shown, 15 images]
[im 6/97  soft-tissue]
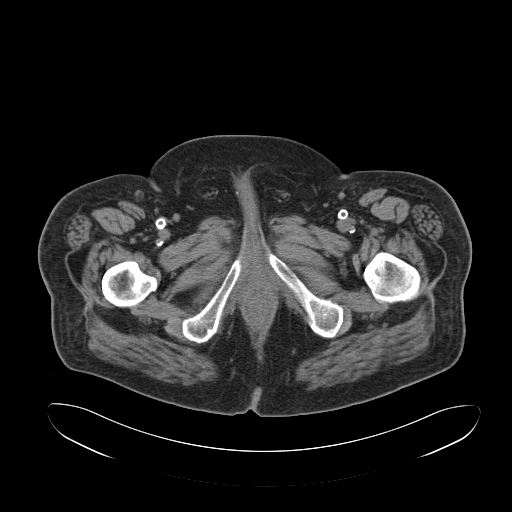
[im 6/97  bone]
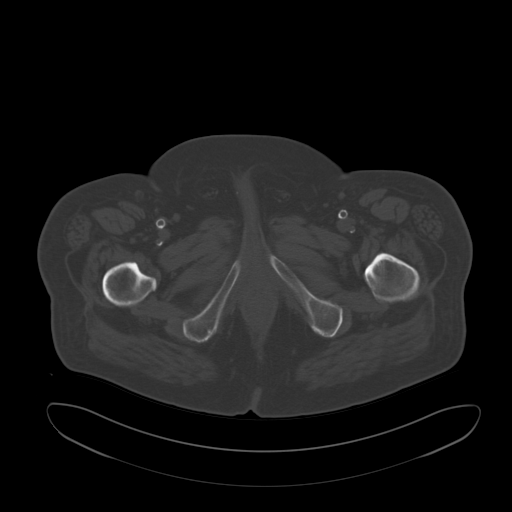
[im 16/97  soft-tissue]
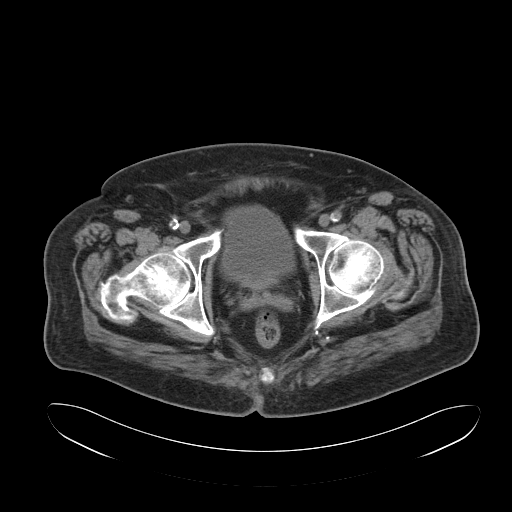
[im 21/97  soft-tissue]
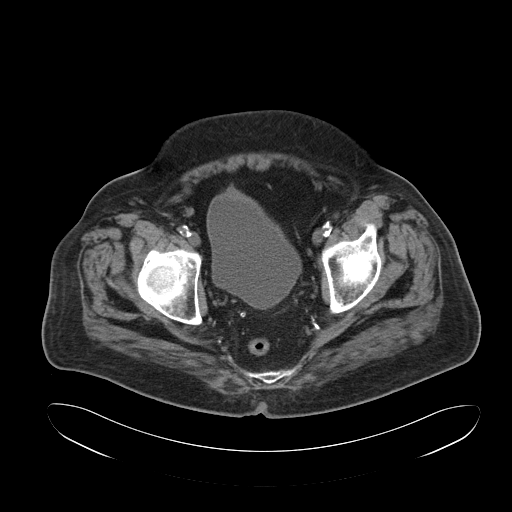
[im 26/97  soft-tissue]
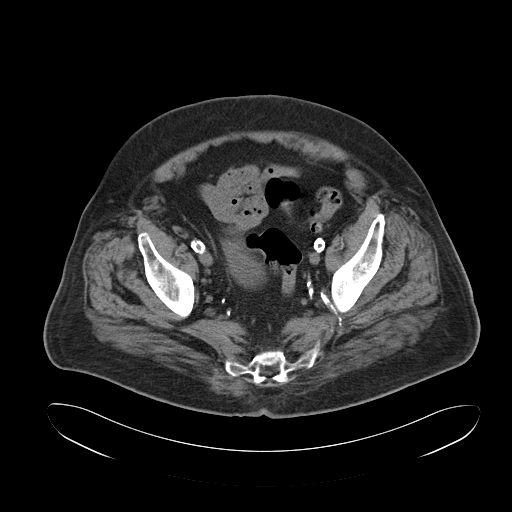
[im 36/97  soft-tissue]
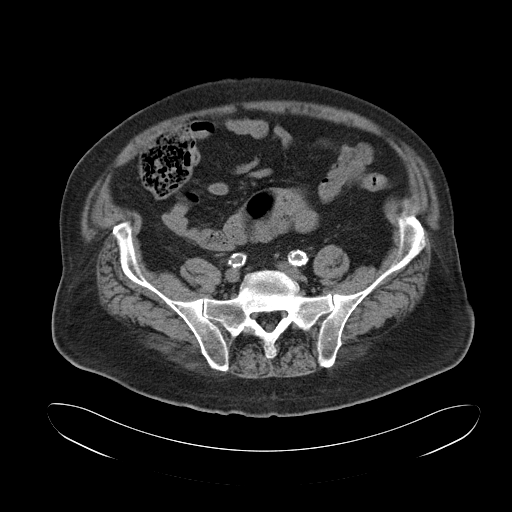
[im 41/97  soft-tissue]
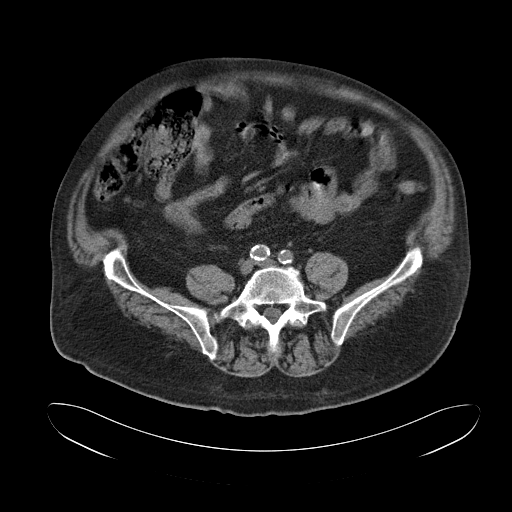
[im 51/97  soft-tissue]
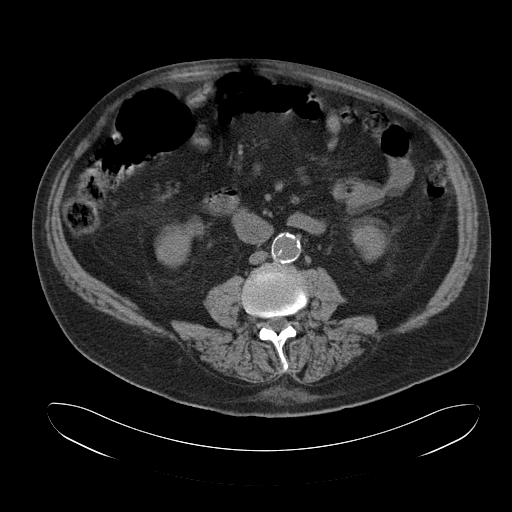
[im 56/97  soft-tissue]
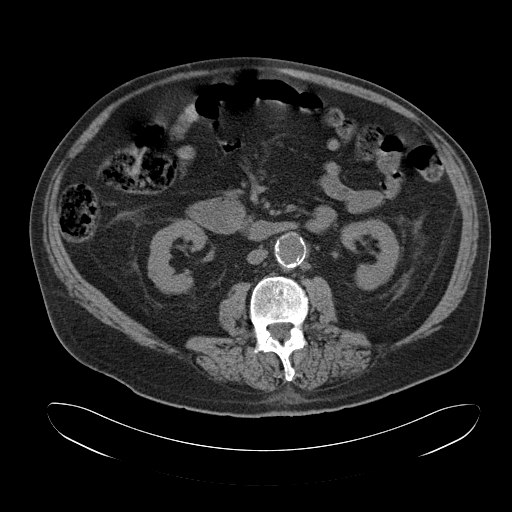
[im 61/97  soft-tissue]
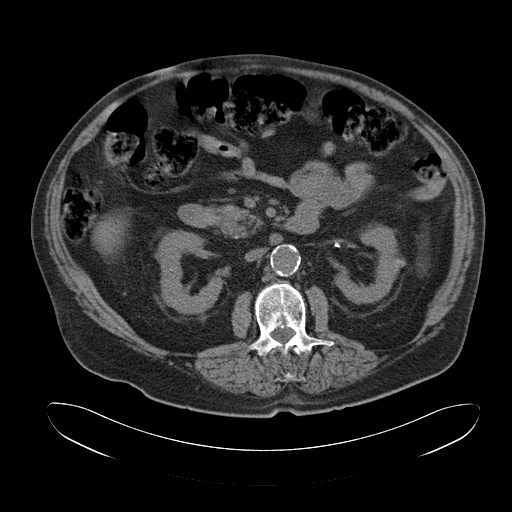
[im 61/97  bone]
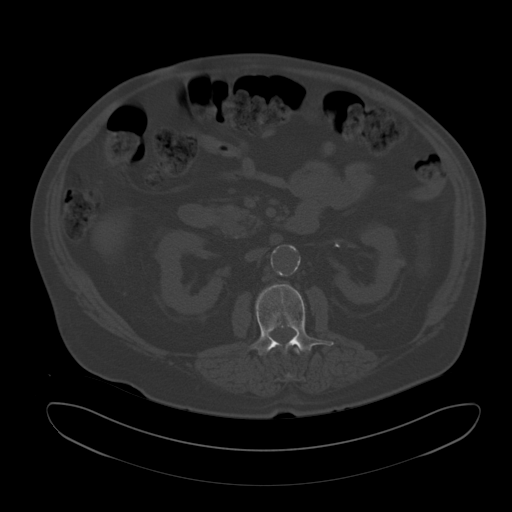
[im 71/97  soft-tissue]
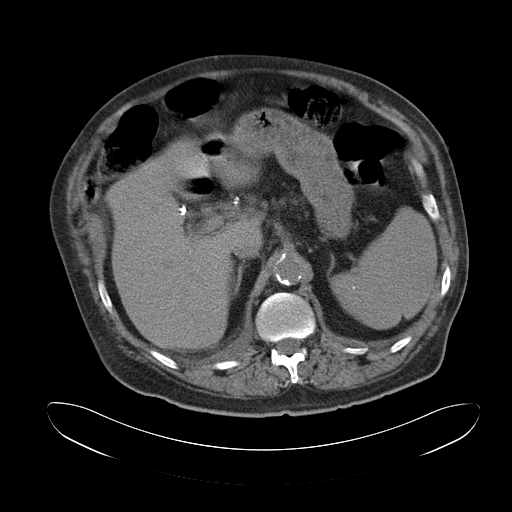
[im 76/97  soft-tissue]
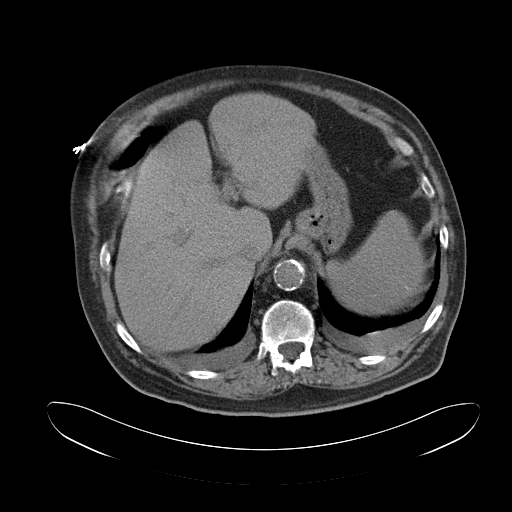
[im 81/97  soft-tissue]
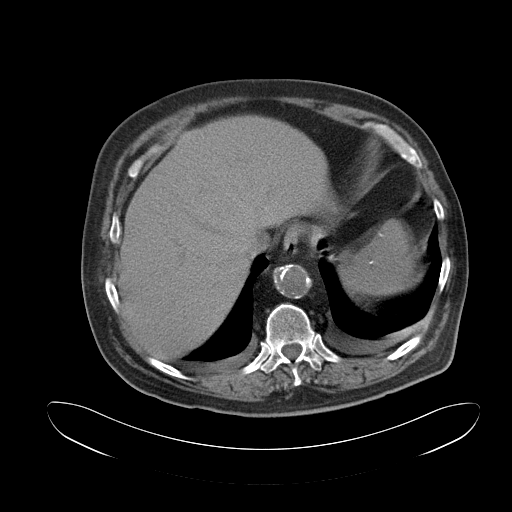
[im 91/97  soft-tissue]
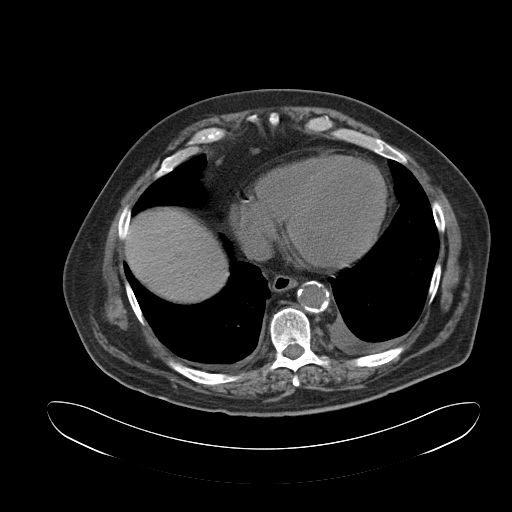

[Series 7: cor stone standard full · coronal · 0.97mm/px · 3 of 157 slices shown]
[im 53/157  soft-tissue]
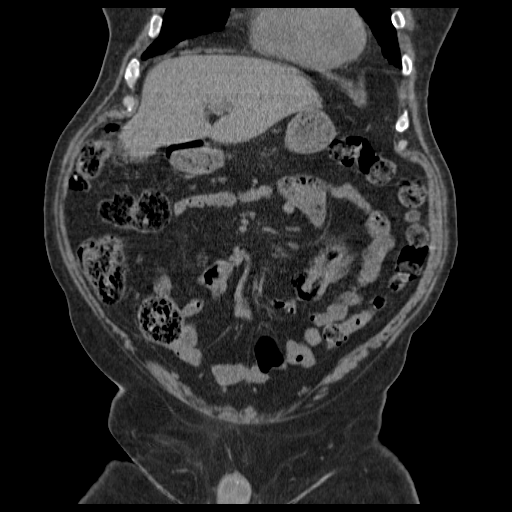
[im 70/157  soft-tissue]
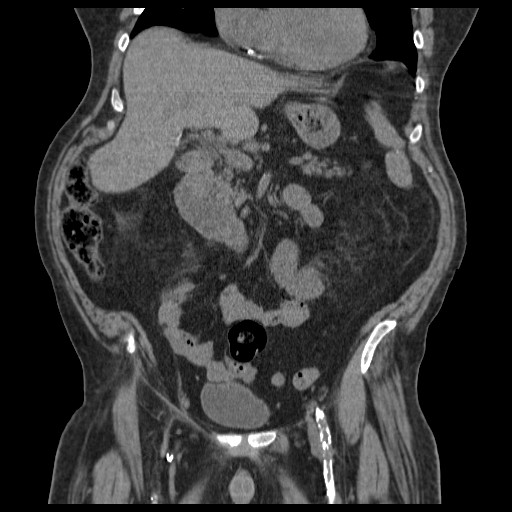
[im 87/157  soft-tissue]
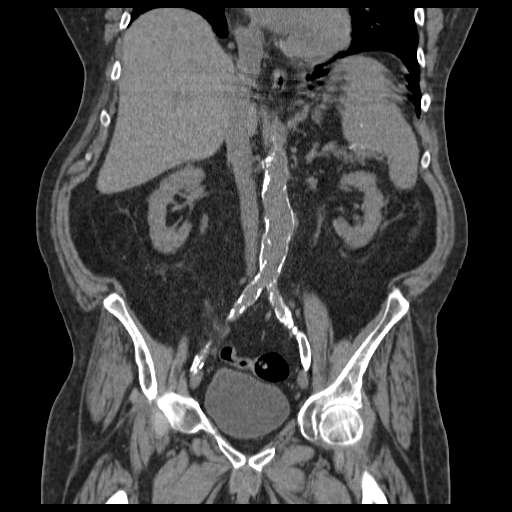

[16 of 46 positions shown; findings below may reference images not displayed]

FINDINGS: Included view of the lung bases demonstrates small layering pleural
effusions. Right lung base calcified granuloma dense Coronary artery
calcifications versus stents. The heart and pericardium are
otherwise unremarkable.

The kidneys are well located, no nephrolithiasis or hydronephrosis.
Exophytic 10 mm right interpolar low-density presumed cysts. 7 mm
fatty mass within lower pole of left kidney likely reflects an
angiomyolipoma. Exophytic 12 mm hyperdensity arising from left
interpolar kidney may reflect a hemorrhagic cyst. Ureters are normal
course and caliber. Urinary bladder is well distended and
unremarkable. Prostate is not enlarged.

Splenic granulomas. A few hepatic calcified granulomas, in a
background of mild hepatomegaly. Status postcholecystectomy.
Pancreas and adrenal glands are nonsuspicious.

Very small hiatal hernia. Stomach, small large bowel are normal in
course and caliber, no pericolonic inflammatory changes. Evaluation
mid abdomen bowel limited by respiratory motion. No intraperitoneal
free fluid or free air.

Severe calcific atherosclerosis of the great vessels, with
infrarenal aortic aneurysm measuring up to 3.1 cm in AP dimension,
with chronic dissection ; aneurysm measured approximately 3 cm on
prior CT. Left common iliac artery aneurysm measures 19 mm. Osseous
structures are nonsuspicious. Mild degenerative changes lumbar
spine.
IMPRESSION: No specific findings to explain right flank pain ; no acute
intra-abdominal or pelvic process. Interval cholecystectomy.

Small layering pleural effusions in the included view of the chest.

Multiple renal cysts and 7 mm left lower pole renal angiomyolipoma,
12 mm hyperdense left interpolar exophytic cyst is likely
hemorrhagic, stable from prior examination.

Severe calcific atherosclerosis of the great vessels with infrarenal
aortic aneurysm measuring 3.1 cm in AP dimension, relatively
unchanged.

  By: Taroon Enami
